# Patient Record
Sex: Female | Born: 1970 | Race: Black or African American | Hispanic: No | Smoking: Current every day smoker
Health system: Southern US, Community
[De-identification: ages and names within clinical notes are randomized; demographics above are authoritative.]

## PROBLEM LIST (undated history)

## (undated) HISTORY — PX: ABDOMINAL HYSTERECTOMY: SHX81

---

## 2001-12-04 ENCOUNTER — Ambulatory Visit (HOSPITAL_COMMUNITY): Admission: RE | Admit: 2001-12-04 | Discharge: 2001-12-04 | Payer: Self-pay | Admitting: Obstetrics and Gynecology

## 2001-12-04 ENCOUNTER — Encounter: Payer: Self-pay | Admitting: Obstetrics and Gynecology

## 2001-12-28 ENCOUNTER — Inpatient Hospital Stay (HOSPITAL_COMMUNITY): Admission: RE | Admit: 2001-12-28 | Discharge: 2001-12-31 | Payer: Self-pay | Admitting: Obstetrics and Gynecology

## 2006-05-14 ENCOUNTER — Emergency Department (HOSPITAL_COMMUNITY): Admission: EM | Admit: 2006-05-14 | Discharge: 2006-05-14 | Payer: Self-pay | Admitting: Emergency Medicine

## 2006-10-02 ENCOUNTER — Encounter: Admission: RE | Admit: 2006-10-02 | Discharge: 2006-10-02 | Payer: Self-pay | Admitting: Obstetrics

## 2007-01-24 ENCOUNTER — Emergency Department (HOSPITAL_COMMUNITY): Admission: EM | Admit: 2007-01-24 | Discharge: 2007-01-24 | Payer: Self-pay | Admitting: Emergency Medicine

## 2007-09-29 ENCOUNTER — Emergency Department (HOSPITAL_COMMUNITY): Admission: EM | Admit: 2007-09-29 | Discharge: 2007-09-29 | Payer: Self-pay | Admitting: Emergency Medicine

## 2008-03-25 ENCOUNTER — Emergency Department (HOSPITAL_COMMUNITY): Admission: EM | Admit: 2008-03-25 | Discharge: 2008-03-26 | Payer: Self-pay | Admitting: Emergency Medicine

## 2009-10-13 ENCOUNTER — Emergency Department (HOSPITAL_BASED_OUTPATIENT_CLINIC_OR_DEPARTMENT_OTHER): Admission: EM | Admit: 2009-10-13 | Discharge: 2009-10-13 | Payer: Self-pay | Admitting: Emergency Medicine

## 2009-10-13 ENCOUNTER — Ambulatory Visit: Payer: Self-pay | Admitting: Diagnostic Radiology

## 2010-09-26 LAB — COMPREHENSIVE METABOLIC PANEL
ALT: 15 U/L (ref 0–35)
AST: 24 U/L (ref 0–37)
BUN: 12 mg/dL (ref 6–23)
Calcium: 8.8 mg/dL (ref 8.4–10.5)
Chloride: 108 mEq/L (ref 96–112)
GFR calc Af Amer: >60 mL/min (ref >60)
Glucose, Bld: 104 mg/dL — ABNORMAL HIGH (ref 70–99)
Potassium: 3.7 mEq/L (ref 3.5–5.1)
Sodium: 142 mEq/L (ref 135–145)
Total Protein: 7.4 g/dL (ref 6.0–8.3)

## 2010-09-26 LAB — URINALYSIS, ROUTINE W REFLEX MICROSCOPIC
Bilirubin Urine: NEGATIVE
Glucose, UA: NEGATIVE mg/dL
Nitrite: NEGATIVE
Protein, ur: NEGATIVE mg/dL
Specific Gravity, Urine: 1.004 — ABNORMAL LOW (ref 1.005–1.030)

## 2010-09-26 LAB — PREGNANCY, URINE: Preg Test, Ur: NEGATIVE

## 2010-11-23 NOTE — Op Note (Signed)
[facility]  Patient:    [name], [name] Visit Number: [id number] MRN: [id number]          Service Type: MED Location: 4A A428 01 Attending Physician:  Tilda Burrow Dictated by:   Christin Bach, M.D. Proc. Date: 12/28/01 Admit Date:  12/28/2001                             Operative Report  PREOPERATIVE DIAGNOSES: 1. Chronic pelvic pain, suspected adenomyosis. 2. Uterine fibroids.  POSTOPERATIVE DIAGNOSIS: 1. Chronic pelvic pain, suspected adenomyosis.  PROCEDURES: 1. Total abdominal hysterectomy. 2. Appendectomy. 3. Wide excision of old abdominal scar.  SURGEON:  Christin Bach, M.D.  ASSISTANTMarlinda Mike, RN.  ANESTHESIA:  General, Idacavage, CRNA.  COMPLICATIONS:  None.  FINDINGS:  Relatively small uterus, soft boggy tissue suggestive of adenomyosis, elongated appendix with fecal material suspected in its tip (fecalith).  DESCRIPTION OF PROCEDURE:  The patient was taken to the operating room, prepped and draped for lower abdominal surgery. The old lower abdominal incision was marked and a 20 cm ellipse of skin and underlying connective tissue removed to include the old scar. This removed portion was approximately 8 cm in greatest width. This was excised. The opening down to the fascia completed a Pfannenstiel-type incision performed with a greater than usual semicircular nature to the incision to allow elevation and access to the midline. The peritoneal cavity was entered without difficulty. The Balfour retractor was positioned. The bowel pushed away with three moistened laparotomy tapes and attention directed to the pelvis. Inspection of the uterus showed it to be upper limits of normal size, 125 g or thereabouts, soft boggy, with normal ovaries bilaterally. Round ligaments were taken down bilaterally by doubly ligating them, transecting between the ligatures, and developing a bladder flap anteriorly. Uteroovarian ligaments and  fallopian tube on each side were then isolated, clamped, cut, and suture ligated bilaterally with 0 chromic suture ligature. The Lahey thyroid tenaculum used to grasp the uterine fundus allowed good mobility to the uterus. The uterine vessels were skeletonized on either side, then a curved Haney clamp placed across them in sequence with a Kelly clamp placed to control back-bleeding, transection, and suture ligature performed. The upper and lower cardinal ligaments were serially clamped, cut, and suture ligated on each side using straight Haney clamps, knife dissection, and 0 chromic suture ligature. Upon reaching the level of the cervix, a stab incision was attempted and the anterior cervical vaginal fornix appeared. It took a second try to properly identify the spot as the first site went through some of the cervix itself. The cervix was circumscribed and removed from off the vaginal cuff and four Kocher clamps used to grasp the cuff edges. Aldridge stitches were placed at each lateral edge angle to control vaginal angle bleeding. The cuff itself was then closed side-to-side using a series of interrupted figure-of-eight sutures of 0 chromic.  Adequate hemostasis was maintained with point cautery as necessary. The pelvis was inspected, irrigated copiously. No evidence of acute bleeding encountered so we proceeded to reapproximate the peritoneum bilaterally and this allowed for excellent support of the ovaries so that they should not reach the vaginal cuff.  Laparotomy tapes were removed and the appendix, which had been distinctly prominent upon entering the peritoneal cavity, was again inspected, found to be about 10 cm in length with easily accessible mesentery and suspicion of fecal material toward the tip of the appendix. We then  proceeded to do an appendectomy, which consisted of isolating the mesoappendix by two curved Heaney clamps placed across the mesoappendix pedicle with a  hemostat placed across the appendiceals base. The specimen was excised and the stump ligated with 0 chromic. The appendiceal stump was imbricated beneath the epiploic fat and adjacent peritoneal surfaces.  Removal of laparotomy equipment and a third irrigation of the pelvis was performed. This showed good hemostasis. The pelvis was reapproximated using 2-0 chromic and then the abdomen irrigated once again. Laparotomy equipment removed. Staple closure used to close the pelvic floor and then the procedure completed by 2-0 chromic closure to the interior peritoneum, 0 Vicryl closure of the fascia, interrupted 2-0 plain closure of the subcutaneous tissue with a J-P drain placed in the incision and allowed to exit through the left side and then staple closure of the skin. Estimated blood loss 250 cc. Dictated by:   Christin Bach, M.D. Attending Physician:  Tilda Burrow DD:  12/28/01 TD:  12/29/01 Job: [id number]

## 2010-11-23 NOTE — Discharge Summary (Signed)
[facility]  Patient:    [name] Visit Number: [id number] MRN: [id number]          Service Type: MED Location: 4A A428 01 Attending Physician:  Tilda Burrow Dictated by:   Christin Bach, M.D. Admit Date:  12/28/2001 Discharge Date: 12/31/2001                             Discharge Summary  ADMITTING DIAGNOSES: 1. Pelvic pain. 2. Uterine fibroids. 3. Clinical history of adenomyosis.  DISCHARGE DIAGNOSES: 1. Pelvic pain. 2. No uterine fibroids. 3. Possible adenomyosis.  PROCEDURE:  Total abdominal hysterectomy, appendectomy, excision of old abdominal scar on December 28, 2001.  DISCHARGE MEDICATIONS: 1. Tylox 1-2 q.4 h. p.r.n. pain. 2. Motrin 800 mg one q.6 h. x10 days for pain. 3. Reglan one p.o. a.c and h.s.  FOLLOWUP:  One week and four weeks our office.  HISTORY OF PRESENT ILLNESS:  This 40 year old female gravida 3, para 1, AB 2, prior C-section x1 was admitted for a hysterectomy after a longstanding evaluation of chronic pelvic pain with diagnostic laparoscopy on July 07, 2001 revealing a mobile uterus, tubes, and ovaries. There was no evidence of adhesions. A deep cul-de-sac was present with permanent uterosacral ligaments. The patient had a six-month history of continued pelvic pain leading to her request for a surgical correction, surgical efforts, and the resolution of pain. The abdomen pulls with bowel movements, pulls with physical activity. Recent ultrasound suggested multiple tiny fibroids, none of them measuring more than 3 cm long. Ultrasound in [facility] endometrial stripe was normal. Depo-Provera contraception has been used most recently.  PAST MEDICAL HISTORY:  The patient was admitted with a medical history benign, surgical history positive for a C-section in 1996, D&C 1998 and 2000.  PHYSICAL EXAMINATION:  GENERAL:  Showing a moderately overweight African-American female who appears alert and  oriented but with a somber affect. She is not felt to be clinically depressed.  VITAL SIGNS:  Blood pressure 130/70.  ABDOMEN:   Well-healed Pfannenstiel incision, slightly tender on the right side above the incision.  GENITALIA AND PELVIC:  Normal external genitalia, uterus slightly enlarged 10 cm on ultrasound.  HOSPITAL COURSE:  A hysterectomy was performed as described in the operative report showing an upper limits of normal size uterus estimated at 125 grams with a boggy uterus, normal-appearing ovaries which were left in place, and a 10 cm appendix with distal stool present. Pathology report showed a 92 gram uterus covered over with small submucosal leiomyomas but no evidence of adenomyosis. The appendix was normal histologically. The old scar was removed with some fibrous dense adhesions that was in the fatty tissue. Postoperatively, the patient had a hemoglobin of 13, hematocrit 38 compared to 13.7 and 39.2 preop. White count was increased to 17,000 on postop day #1. On the next day, she had a low-grade temperature to 100.3 and had what appeared to be a migraine headache which was treated with analgesics including Imitrex which seemed to help. Anesthesia consulted and decided that there was no evidence of an anesthesia related headache. The following day she was stable for discharge on December 31, 2001, seemingly somewhat depressed but denying any somatic complaints other than the obvious surgical discomforts. The patient was discharged home for followup in one week for staple removal and four weeks our office. Dictated by:   Christin Bach, M.D. Attending Physician:  Tilda Burrow DD:  01/13/02  TD:  01/16/02 Job: [id number]

## 2011-04-01 LAB — BASIC METABOLIC PANEL
Creatinine, Ser: 0.9
GFR calc Af Amer: >60
GFR calc non Af Amer: >60
Glucose, Bld: 92

## 2011-04-01 LAB — POCT CARDIAC MARKERS: Troponin i, poc: <0.05

## 2011-04-08 LAB — POCT I-STAT, CHEM 8
Calcium, Ion: 1.18
Chloride: 102
Creatinine, Ser: 1.2
Glucose, Bld: 86
HCT: 45
Hemoglobin: 15.3 — ABNORMAL HIGH
Potassium: 3.8
Sodium: 138

## 2011-04-08 LAB — CBC
HCT: 42.1
MCHC: 34.3
MCV: 94.8
Platelets: 252
WBC: 12.2 — ABNORMAL HIGH

## 2011-04-08 LAB — DIFFERENTIAL
Basophils Absolute: 0.1
Basophils Relative: 1
Eosinophils Absolute: 0.1
Lymphocytes Relative: 36
Monocytes Absolute: 0.7

## 2011-04-22 LAB — CBC
HCT: 39.6
Hemoglobin: 13.7
MCV: 95.6
Platelets: 274
RDW: 13.8
WBC: 14.3 — ABNORMAL HIGH

## 2011-04-22 LAB — I-STAT 8, (EC8 V) (CONVERTED LAB)
BUN: 8
Glucose, Bld: 112 — ABNORMAL HIGH
Potassium: 3.9
pCO2, Ven: 51.5 — ABNORMAL HIGH

## 2011-04-22 LAB — DIFFERENTIAL
Basophils Relative: 0
Eosinophils Absolute: 0.1
Lymphs Abs: 2.7
Monocytes Absolute: 0.7

## 2011-04-22 LAB — POCT I-STAT CREATININE: Operator id: 284141

## 2011-04-22 LAB — D-DIMER, QUANTITATIVE: D-Dimer, Quant: 0.76 — ABNORMAL HIGH

## 2013-08-24 ENCOUNTER — Emergency Department (HOSPITAL_COMMUNITY)
Admission: EM | Admit: 2013-08-24 | Discharge: 2013-08-24 | Disposition: A | Discharge status: Home / Self Care | Payer: No Typology Code available for payment source | Attending: Emergency Medicine | Admitting: Emergency Medicine

## 2013-08-24 ENCOUNTER — Emergency Department (HOSPITAL_COMMUNITY): Payer: No Typology Code available for payment source

## 2013-08-24 ENCOUNTER — Encounter (HOSPITAL_COMMUNITY): Payer: Self-pay | Admitting: Emergency Medicine

## 2013-08-24 DIAGNOSIS — Y9389 Activity, other specified: Secondary | ICD-10-CM | POA: Insufficient documentation

## 2013-08-24 DIAGNOSIS — IMO0002 Reserved for concepts with insufficient information to code with codable children: Secondary | ICD-10-CM | POA: Insufficient documentation

## 2013-08-24 DIAGNOSIS — F17200 Nicotine dependence, unspecified, uncomplicated: Secondary | ICD-10-CM | POA: Insufficient documentation

## 2013-08-24 DIAGNOSIS — R0789 Other chest pain: Secondary | ICD-10-CM

## 2013-08-24 DIAGNOSIS — S298XXA Other specified injuries of thorax, initial encounter: Secondary | ICD-10-CM | POA: Insufficient documentation

## 2013-08-24 DIAGNOSIS — Z79899 Other long term (current) drug therapy: Secondary | ICD-10-CM | POA: Insufficient documentation

## 2013-08-24 DIAGNOSIS — Y92410 Unspecified street and highway as the place of occurrence of the external cause: Secondary | ICD-10-CM | POA: Insufficient documentation

## 2013-08-24 DIAGNOSIS — M546 Pain in thoracic spine: Secondary | ICD-10-CM

## 2013-08-24 MED ORDER — IBUPROFEN 600 MG PO TABS: 600.0000 mg | ORAL_TABLET | Freq: Four times a day (QID) | ORAL | Status: AC | PRN

## 2013-08-24 MED ORDER — DIAZEPAM 5 MG PO TABS: 5.0000 mg | ORAL_TABLET | Freq: Two times a day (BID) | ORAL | Status: AC

## 2013-08-24 MED ORDER — DIAZEPAM 5 MG PO TABS: 5.0000 mg | ORAL_TABLET | Freq: Two times a day (BID) | ORAL | Status: DC

## 2013-08-24 MED ORDER — HYDROCODONE-ACETAMINOPHEN 5-325 MG PO TABS
2.0000 | ORAL_TABLET | Freq: Once | ORAL | Status: AC
  Administered 2013-08-24: 2 via ORAL
  Filled 2013-08-24: qty 2

## 2013-08-24 MED ORDER — IBUPROFEN 800 MG PO TABS
800.0000 mg | ORAL_TABLET | Freq: Once | ORAL | Status: AC
  Administered 2013-08-24: 800 mg via ORAL
  Filled 2013-08-24: qty 1

## 2013-08-24 MED ORDER — IBUPROFEN 600 MG PO TABS: 600.0000 mg | ORAL_TABLET | Freq: Four times a day (QID) | ORAL | Status: DC | PRN

## 2013-08-24 NOTE — Discharge Instructions (Signed)
Chest Wall Pain °Chest wall pain is pain felt in or around the chest bones and muscles. It may take up to 6 weeks to get better. It may take longer if you are active. Chest wall pain can happen on its own. Other times, things like germs, injury, coughing, or exercise can cause the pain. °HOME CARE  °· Avoid activities that make you tired or cause pain. Try not to use your chest, belly (abdominal), or side muscles. Do not use heavy weights. °· Put ice on the sore area. °· Put ice in a plastic bag. °· Place a towel between your skin and the bag. °· Leave the ice on for 15-20 minutes for the first 2 days. °· Only take medicine as told by your doctor. °GET HELP RIGHT AWAY IF:  °· You have more pain or are very uncomfortable. °· You have a fever. °· Your chest pain gets worse. °· You have new problems. °· You feel sick to your stomach (nauseous) or throw up (vomit). °· You start to sweat or feel lightheaded. °· You have a cough with mucus (phlegm). °· You cough up blood. °MAKE SURE YOU:  °· Understand these instructions. °· Will watch your condition. °· Will get help right away if you are not doing well or get worse. °Document Released: 12/11/2007 Document Revised: 09/16/2011 Document Reviewed: 02/18/2011 °ExitCare® Patient Information ©2014 [employer]. ° ° °Emergency Department Resource Guide °1) Find a Doctor and Pay Out of Pocket °Although you won't have to find out who is covered by your insurance plan, it is a good idea to ask around and get recommendations. You will then need to call the office and see if the doctor you have chosen will accept you as a new patient and what types of options they offer for patients who are self-pay. Some doctors offer discounts or will set up payment plans for their patients who do not have insurance, but you will need to ask so you aren't surprised when you get to your appointment. ° °2) Contact Your Local Health Department °Not all health departments have doctors that can see  patients for sick visits, but many do, so it is worth a call to see if yours does. If you don't know where your local health department is, you can check in your phone book. The CDC also has a tool to help you locate your state's health department, and many state websites also have listings of all of their local health departments. ° °3) Find a Walk-in Clinic °If your illness is not likely to be very severe or complicated, you may want to try a walk in clinic. These are popping up all over the country in pharmacies, drugstores, and shopping centers. They're usually staffed by nurse practitioners or physician assistants that have been trained to treat common illnesses and complaints. They're usually fairly quick and inexpensive. However, if you have serious medical issues or chronic medical problems, these are probably not your best option. ° °No Primary Care Doctor: °- Call [facility] Connect at  832-8000 - they can help you locate a primary care doctor that  accepts your insurance, provides certain services, etc. °- Physician Referral Service- 1-800-533-3463 ° °Chronic Pain Problems: °Organization         Address  Phone   Notes  °[facility]  (336) 297-2271 Patients need to be referred by their primary care doctor.  ° °Medication Assistance: °Organization         Address    Phone   Notes  °[place] [facility] [address] °[place], [place] [address] (336) 641-8030 --Must be a resident of [place] °-- Must have NO insurance coverage whatsoever (no Medicaid/ Medicare, etc.) °-- The pt. MUST have a primary care doctor that directs their care regularly and follows them in the community °  °[facility]  (866) 331-1348   °[employer]  (888) 892-1162   ° °Agencies that provide inexpensive medical care: °Organization         Address  Phone   Notes  °[facility]  (336) 832-8035   °[facility]    (336) 832-7272   °[facility] [address] °[place], [place] [address] (336) 832-4777   °[facility] [address], °[place] (336) 271-4999   °[facility]    (336) 373-0678   °[facility]    (336) 272-1050   °[facility] ° [address], [place] Phone:  (336) 832-4444, Fax:  (336) 832-4440 Hours of Operation:  9 am - 6 pm, M-F.  Also accepts Medicaid/Medicare and self-pay.  °[facility] ° [address], [place] Phone: (336) 832-3150, Fax: (336) 832-3151. Hours of Operation:  8:30 am - 5:30 pm, M-F.  Also accepts Medicaid and self-pay.  °[facility] [place] [address], [place] Phone: (336) 878-6027   °[facility] [address], [place], [place] (336)723-1848, Ext. 123 Mondays & Thursdays: 7-9 AM.  First 15 patients are seen on a first come, first serve basis. °  ° °Medicaid-accepting [place] Providers: ° °Organization         Address  Phone   Notes  °[facility] [address], [place] (336) 641-2100 Also accepts self-pay patients.  °[facility] [address], [place] ° (336) 856-9996   °[facility] [address], [place] (336) 288-8857   °[facility] [address] [place] [address], [place] (336) 299-7000   °Veita Bland [address], [place]  ° (336) 373-1557 Only accepts [place] Medicaid patients after they have their name applied to their card.  ° °Self-Pay (no insurance) in [place]: ° °Organization         Address  Phone   Notes  °Sickle Cell Patients, [facility] [address], [place] (336) 832-1970   °[facility] [address], [place] (336) 832-4400   ° [facility] ° [address], [place] (336) 992-4800   °[facility]/Dr. Osei-Bonsu °  [address] [place] [address], [place] or [address], [place] (336) 841-8500 Phone number for both [place] and [place] locations is the same.  °[facility] [address], [place] (336) 299-0000   °[facility] [address] [place] [address], [place] or [address] (336) 852-7530 °(336) 878-2260   °[facility] [address],  (336) 350-1642, phone; (336) 294-5005, fax Sees patients 1st and 3rd Saturday of every month.  Must not qualify for public or private insurance (i.e. Medicaid, Medicare, [place] Health Choice, Veterans' Benefits) • Household income should be no more than 200% of the poverty level •The clinic cannot treat you if you are pregnant or think you are pregnant •   Sexually transmitted diseases are not treated at the clinic.  ° ° °Dental Care: °Organization         Address  Phone  Notes  °[place] [facility] [address], [place] (336) 641-6152 Accepts children up to age 21 who are enrolled in Medicaid or [place] Health Choice; pregnant women with a Medicaid card; and children who have applied for Medicaid or [place] Health Choice, but were declined, whose parents can pay a reduced fee at time of service.  °[facility] [place]  [address], [place] (336) 641-7733 Accepts children up to age 21 who are enrolled in Medicaid or [place] Health Choice; pregnant women with a Medicaid card; and children who have applied for Medicaid or [place] Health Choice, but were declined, whose parents can pay a reduced fee at time of service.  °[facility] ° [address], [place] (336) 641-4533 Patients are seen by appointment only. Walk-ins are not accepted. [facility] will see patients 18 years of age and older. °Monday - Tuesday (8am-5pm) °Most Wednesdays (8:30-5pm) °$30 per visit, cash only  °[facility] ° [address], [place] (336) 641-4533 Patients are seen by appointment only. Walk-ins are not accepted. [facility] will see patients 18 years of age and older. °One Wednesday Evening (Monthly: Volunteer Based).  $30 per visit, cash only  °[facility]  (919) 537-3737 for adults; Children under age 4, call Graduate Pediatric Dentistry at (919) [phone]. Children aged 4-14, please call (919) [phone] to request a pediatric application. ° Dental services are provided in all areas of dental care including fillings, crowns and bridges, complete and partial dentures, implants, gum treatment, root canals, and extractions. Preventive care is also provided. Treatment is provided to both adults and children. °Patients are selected via a lottery and there is often a waiting list. °  °[facility] [address], °[place] ° (336) 763-8833 [email or web address].[email or web address] °  °[facility] [address], [place], [place] (336)723-1848, Ext. 123 Second and Fourth Thursday of each month, opens at 6:30 AM; Clinic ends at 9 AM.  Patients are seen on a first-come first-served basis, and a limited number are seen during each clinic.  ° °[facility] ° [address], [place], [place] (336) 723-7904   Eligibility Requirements °You must have lived in [place], [place], or [place] counties for at least the last three months. °  You cannot be eligible for state or federal sponsored healthcare insurance, including Veterans Administration, Medicaid, or Medicare. °  You generally cannot be eligible for healthcare insurance through your employer.  °  How to apply: °Eligibility screenings are held every Tuesday and Wednesday afternoon from 1:00 pm until 4:00 pm. You do not need an appointment for the interview!  °[facility] [address], [place], [place] 336-631-2330   °[place] [facility]  336-342-8273   °[place]   336-703-3100   °[facility]  336-570-6415   ° °Behavioral Health Resources in the Community: °Intensive Outpatient Programs °Organization         Address  Phone  Notes  °[facility] [address], [place], [place] 336-878-6098   °[facility] [address], [place], [place] 336-832-9800   °[facility] [address], [place],  °   336-882-2125   °[facility] [address],  °[place], [place] 1-800-853-5163 or 336-641-4981   °Substance Abuse Resources °Organization         Address  Phone  Notes  °[facility]  336-882-2125   °[facility]  336-784-9470   °[facility]  336-285-9073   °[facility]  336-845-3988   °Residential & Outpatient Substance Abuse Program  1-800-659-3381   °Psychological Services °Organization         Address  Phone  Notes  °[facility]  336- 832-9600   °[facility]  336- 378-7881   °[facility] [address], [place] 1-800-853-5163 or 336-641-4981   ° °Mobile Crisis Teams °Organization         Address  Phone  Notes  °[facility], Mobile Crisis Care Unit  1-877-626-1772   °Assertive °[facility] ° [address] [place], [place] 336-834-9664   °Sharon DeEsch [address] °[place] 336-554-5454   ° °Self-Help/Support Groups °Organization         Address  Phone             Notes  °[facility] [place] - variety of support groups  336- 373-1402 Call for more information  °Narcotics Anonymous (NA), [facility] [address], °[place]  2 meetings at this location  ° °Residential Treatment Programs °Organization         Address  Phone  Notes  °[facility] [address],    °[place]  1-866-801-8205   °[facility] ° [address], [place], [place] 704-293-8524   °[facility] [address], [place] 336-845-3988 Admissions: 8am-3pm M-F  °[facility] [address]    °[place], [place] 336-841-1104   °[facility] [address], [place], [place] 336-379-7146   °[facility] [address]  °[place], [place] 336-285-9073   °[facility] - Intensive Outpatient [address], [place], [place] 336-852-3033   °[facility] [address]  °[place], [place] 1-877-615-2722 or 336-784-9470   °[facility] ([facility] [address] [place], [place] 336-227-7417 Accepts Medicaid  °[facility] [address]  °[place] 1-800-659-3381 Substance Abuse/Addiction Treatment  ° °[place] Behavioral Health Resources °Organization         Address  Phone  Notes  °[facility]  (888) 581-9988   °Julie Brannon, PhD [address] [place], [place]   (336) 349-5553 or (336) 951-0000   °[facility]   [address] °[place], [place] (336) 349-4454   °[facility] [address], [place], [place] (336) 342-8316 Insurance/Medicaid/sponsorship through [facility]  °[facility] [address]                                    [place], [place] (336) 342-8316 Therapy/tele-psych/case  °[facility] [address]  ° [place], [place] (336) 349-2233    °Dr. Arfeen  (336) 349-4544   °[facility] [place]  [employer] [place] [facility] 1) [address], [place] °2) [address], [place] °3)  [address], [place] (336) 349-3220 °(336) 342-7768 ° °(336) 342-8140   °[place] Child Abuse [facility] (336) 342-1394 or (336) 342-3537 (  After Hours)    ° ° ° °  [address], [place] °3)  [address], [place] (336) 349-3220 °(336) 342-7768 ° °(336) 342-8140   °[place] Child Abuse [facility]line (336) 342-1394 or (336) 342-3537 (After Hours)    ° ° °

## 2013-08-24 NOTE — ED Provider Notes (Signed)
Medical screening examination/treatment/procedure(s) were performed by non-physician practitioner and as supervising physician I was immediately available for consultation/collaboration.  EKG Interpretation    Date/Time:  Tuesday August 24 2013 10:01:36 EST Ventricular Rate:  86 PR Interval:  134 QRS Duration: 73 QT Interval:  361 QTC Calculation: 432 R Axis:   50 Text Interpretation:  Sinus rhythm Baseline wander in lead(s) II III aVF No significant change since last tracing except baseline artifact Confirmed by Carleen Rhue  MD-J, Kamarah Bilotta ([id number]) on 08/24/2013 10:21:16 AM              Celene KrasJon R Khush Pasion, MD 08/24/13 1600
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
## 2015-05-08 ENCOUNTER — Emergency Department (HOSPITAL_BASED_OUTPATIENT_CLINIC_OR_DEPARTMENT_OTHER): Payer: PRIVATE HEALTH INSURANCE

## 2015-05-08 ENCOUNTER — Emergency Department (HOSPITAL_BASED_OUTPATIENT_CLINIC_OR_DEPARTMENT_OTHER)
Admission: EM | Admit: 2015-05-08 | Discharge: 2015-05-08 | Disposition: A | Discharge status: Home / Self Care | Payer: PRIVATE HEALTH INSURANCE | Attending: Emergency Medicine | Admitting: Emergency Medicine

## 2015-05-08 ENCOUNTER — Encounter (HOSPITAL_BASED_OUTPATIENT_CLINIC_OR_DEPARTMENT_OTHER): Payer: Self-pay | Admitting: Emergency Medicine

## 2015-05-08 DIAGNOSIS — Z9889 Other specified postprocedural states: Secondary | ICD-10-CM | POA: Insufficient documentation

## 2015-05-08 DIAGNOSIS — Z79899 Other long term (current) drug therapy: Secondary | ICD-10-CM | POA: Diagnosis not present

## 2015-05-08 DIAGNOSIS — Z90710 Acquired absence of both cervix and uterus: Secondary | ICD-10-CM | POA: Insufficient documentation

## 2015-05-08 DIAGNOSIS — K529 Noninfective gastroenteritis and colitis, unspecified: Secondary | ICD-10-CM | POA: Diagnosis not present

## 2015-05-08 DIAGNOSIS — R1084 Generalized abdominal pain: Secondary | ICD-10-CM

## 2015-05-08 DIAGNOSIS — Z720 Tobacco use: Secondary | ICD-10-CM | POA: Diagnosis not present

## 2015-05-08 LAB — COMPREHENSIVE METABOLIC PANEL
ALT: 24 U/L (ref 14–54)
AST: 34 U/L (ref 15–41)
Albumin: 4 g/dL (ref 3.5–5.0)
Alkaline Phosphatase: 52 U/L (ref 38–126)
Anion gap: 7 (ref 5–15)
BUN: 12 mg/dL (ref 6–20)
CO2: 24 mmol/L (ref 22–32)
CREATININE: 0.96 mg/dL (ref 0.44–1.00)
Calcium: 8.8 mg/dL — ABNORMAL LOW (ref 8.9–10.3)
Chloride: 106 mmol/L (ref 101–111)
Glucose, Bld: 99 mg/dL (ref 65–99)
Potassium: 4.1 mmol/L (ref 3.5–5.1)
Sodium: 137 mmol/L (ref 135–145)
TOTAL PROTEIN: 7.6 g/dL (ref 6.5–8.1)
Total Bilirubin: 0.7 mg/dL (ref 0.3–1.2)

## 2015-05-08 LAB — CBC WITH DIFFERENTIAL/PLATELET
BASOS ABS: 0 10*3/uL (ref 0.0–0.1)
Basophils Relative: 0 %
Eosinophils Absolute: 0.1 10*3/uL (ref 0.0–0.7)
Eosinophils Relative: 1 %
HCT: 41.1 % (ref 36.0–46.0)
Hemoglobin: 14.4 g/dL (ref 12.0–15.0)
LYMPHS ABS: 3.8 10*3/uL (ref 0.7–4.0)
Lymphocytes Relative: 28 %
MCH: 32 pg (ref 26.0–34.0)
MCHC: 35 g/dL (ref 30.0–36.0)
MCV: 91.3 fL (ref 78.0–100.0)
MONO ABS: 1.1 10*3/uL — AB (ref 0.1–1.0)
Monocytes Relative: 8 %
Neutro Abs: 8.6 10*3/uL — ABNORMAL HIGH (ref 1.7–7.7)
Neutrophils Relative %: 63 %
PLATELETS: 323 10*3/uL (ref 150–400)
RBC: 4.5 MIL/uL (ref 3.87–5.11)
RDW: 12.8 % (ref 11.5–15.5)
WBC: 13.6 10*3/uL — AB (ref 4.0–10.5)

## 2015-05-08 LAB — URINALYSIS, ROUTINE W REFLEX MICROSCOPIC
Bilirubin Urine: NEGATIVE
Glucose, UA: NEGATIVE mg/dL
HGB URINE DIPSTICK: NEGATIVE
KETONES UR: NEGATIVE mg/dL
Leukocytes, UA: NEGATIVE
Nitrite: NEGATIVE
Protein, ur: NEGATIVE mg/dL
Specific Gravity, Urine: 1.017 (ref 1.005–1.030)
Urobilinogen, UA: 1 mg/dL (ref 0.0–1.0)
pH: 6 (ref 5.0–8.0)

## 2015-05-08 LAB — LIPASE, BLOOD: LIPASE: 29 U/L (ref 11–51)

## 2015-05-08 MED ORDER — ONDANSETRON HCL 4 MG/2ML IJ SOLN
INTRAMUSCULAR | Status: AC
  Filled 2015-05-08: qty 2

## 2015-05-08 MED ORDER — ONDANSETRON 8 MG PO TBDP: ORAL_TABLET | ORAL | Status: AC

## 2015-05-08 MED ORDER — SODIUM CHLORIDE 0.9 % IV BOLUS (SEPSIS)
1000.0000 mL | Freq: Once | INTRAVENOUS | Status: AC
  Administered 2015-05-08: 1000 mL via INTRAVENOUS

## 2015-05-08 MED ORDER — ONDANSETRON HCL 4 MG/2ML IJ SOLN
4.0000 mg | Freq: Once | INTRAMUSCULAR | Status: AC
  Administered 2015-05-08: 4 mg via INTRAVENOUS

## 2015-05-08 MED ORDER — KETOROLAC TROMETHAMINE 30 MG/ML IJ SOLN
30.0000 mg | Freq: Once | INTRAMUSCULAR | Status: AC
  Administered 2015-05-08: 30 mg via INTRAVENOUS

## 2015-05-08 MED ORDER — KETOROLAC TROMETHAMINE 30 MG/ML IJ SOLN
INTRAMUSCULAR | Status: AC
  Filled 2015-05-08: qty 1

## 2015-05-08 MED ORDER — IOHEXOL 300 MG/ML  SOLN
125.0000 mL | Freq: Once | INTRAMUSCULAR | Status: AC | PRN
  Administered 2015-05-08: 125 mL via INTRAVENOUS

## 2015-05-08 MED ORDER — IOHEXOL 300 MG/ML  SOLN
50.0000 mL | Freq: Once | INTRAMUSCULAR | Status: AC | PRN
  Administered 2015-05-08: 50 mL via ORAL

## 2015-05-08 MED ORDER — IOHEXOL 300 MG/ML  SOLN: 50.0000 mL | Freq: Once | INTRAMUSCULAR | Status: DC | PRN

## 2015-05-08 NOTE — ED Notes (Signed)
Patient transported to CT 

## 2015-05-08 NOTE — ED Provider Notes (Signed)
CSN: [id number]     Arrival date & time 05/08/15  0134 History   First MD Initiated Contact with Patient 05/08/15 0221     Chief Complaint  Patient presents with  . Abdominal Pain     (Consider location/radiation/quality/duration/timing/severity/associated sxs/prior Treatment) HPI Comments: Patient is a 44 year old female with history of hysterectomy. She presents for evaluation of abdominal cramping. She does report some nausea, vomiting, and diarrhea for the past couple of days, however this has resolved. She denies any fevers or chills. She states that she feels as though her stomach is "churning". 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 She denies having consumed any suspicious foods that may have made her sick.  Patient is a 44 y.o. female presenting with abdominal pain. The history is provided by the patient.  Abdominal Pain Pain location:  Generalized Pain quality: cramping   Pain radiates to:  Does not radiate Pain severity:  Moderate Onset quality:  Sudden Duration:  4 days Timing:  Constant Progression:  Worsening Chronicity:  New Relieved by:  Nothing Worsened by:  Nothing tried Ineffective treatments:  None tried   History reviewed. No pertinent past medical history. Past Surgical History  Procedure Laterality Date  . Abdominal hysterectomy    . Cesarean section     No family history on file. Social History  Substance Use Topics  . Smoking status: Current Every Day Smoker -- 0.50 packs/day    Types: Cigarettes  . Smokeless tobacco: None  . Alcohol Use: Yes     Comment: occ   OB History    No data available     Review of Systems  Gastrointestinal: Positive for abdominal pain.  All other systems reviewed and are negative.     Allergies  Review of patient's allergies indicates no known allergies.  Home Medications   Prior to Admission medications   Medication Sig Start Date End Date Taking?  Authorizing Provider  acetaminophen (TYLENOL) 500 MG tablet Take 1,000 mg by mouth every 6 (six) hours as needed (pain.).   Yes Historical Provider, MD  diazepam (VALIUM) 5 MG tablet Take 1 tablet (5 mg total) by mouth 2 (two) times daily. 08/24/13  Yes Junius FinnerErin O'Malley, PA-C  ibuprofen (ADVIL,MOTRIN) 600 MG tablet Take 1 tablet (600 mg total) by mouth every 6 (six) hours as needed. 08/24/13  Yes Junius FinnerErin O'Malley, PA-C   BP 129/84 mmHg  Pulse 71  Temp(Src) 98.5 F (36.9 C) (Oral)  Resp 20  Ht 5' 8.5" (1.74 m)  Wt 285 lb (129.275 kg)  BMI 42.70 kg/m2  SpO2 99% Physical Exam  Constitutional: She is oriented to person, place, and time. She appears well-developed and well-nourished. No distress.  HENT:  Head: Normocephalic and atraumatic.  Neck: Normal range of motion. Neck supple.  Cardiovascular: Normal rate and regular rhythm.  Exam reveals no gallop and no friction rub.   No murmur heard. Pulmonary/Chest: Effort normal and breath sounds normal. No respiratory distress. She has no wheezes.  Abdominal: Soft. Bowel sounds are normal. She exhibits no distension. There is tenderness. There is no rebound and no guarding.  There is tenderness to palpation throughout the abdomen, however most notable in the epigastrium and periumbilical regions.  Musculoskeletal: Normal range of motion.  Neurological: She is alert and oriented to person, place, and time.  Skin: Skin is warm and dry. She is not diaphoretic.  Nursing note and vitals reviewed.   ED Course  Procedures (including critical care time) Labs Review Labs Reviewed  CBC WITH DIFFERENTIAL/PLATELET - Abnormal; Notable for the following:    WBC 13.6 (*)    All other components within normal limits  URINALYSIS, ROUTINE W REFLEX MICROSCOPIC (NOT AT [facility])  COMPREHENSIVE METABOLIC PANEL  LIPASE, BLOOD    Imaging Review No results found. I have personally reviewed and evaluated these images and lab results as part of my medical  decision-making.   EKG Interpretation None      MDM   Final diagnoses:  None    Patient presents with complaints of generalized abdominal pain, nausea, vomiting, diarrhea for the past several days. Her initial laboratory studies reveal a white count of 13,000, however are otherwise unremarkable. She is given IV fluids and medications, but continues with some discomfort. We discussed the risks and benefits of a CT scan of the patient has decided to go ahead and have this performed. This was done and reveals no evidence for intra-abdominal pathology. She will be discharged to home with Zofran and when necessary return.    Geoffery Lyons, MD 05/08/15 [phone]

## 2015-05-08 NOTE — ED Notes (Signed)
Pt returned from CT °

## 2015-05-08 NOTE — Discharge Instructions (Signed)
Zofran as prescribed.  Return to the emergency department if you develop severe abdominal pain, bloody stool, or other new and concerning symptoms.   Abdominal Pain, Adult Many things can cause abdominal pain. Usually, abdominal pain is not caused by a disease and will improve without treatment. It can often be observed and treated at home. Your health care provider will do a physical exam and possibly order blood tests and X-rays to help determine the seriousness of your pain. However, in many cases, more time must pass before a clear cause of the pain can be found. Before that point, your health care provider may not know if you need more testing or further treatment. HOME CARE INSTRUCTIONS Monitor your abdominal pain for any changes. The following actions may help to alleviate any discomfort you are experiencing:  Only take over-the-counter or prescription medicines as directed by your health care provider.  Do not take laxatives unless directed to do so by your health care provider.  Try a clear liquid diet (broth, tea, or water) as directed by your health care provider. Slowly move to a bland diet as tolerated. SEEK MEDICAL CARE IF:  You have unexplained abdominal pain.  You have abdominal pain associated with nausea or diarrhea.  You have pain when you urinate or have a bowel movement.  You experience abdominal pain that wakes you in the night.  You have abdominal pain that is worsened or improved by eating food.  You have abdominal pain that is worsened with eating fatty foods.  You have a fever. SEEK IMMEDIATE MEDICAL CARE IF:  Your pain does not go away within 2 hours.  You keep throwing up (vomiting).  Your pain is felt only in portions of the abdomen, such as the right side or the left lower portion of the abdomen.  You pass bloody or black tarry stools. MAKE SURE YOU:  Understand these instructions.  Will watch your condition.  Will get help right away if you  are not doing well or get worse.   This information is not intended to replace advice given to you by your health care provider. Make sure you discuss any questions you have with your health care provider.   Document Released: 04/03/2005 Document Revised: 03/15/2015 Document Reviewed: 03/03/2013 Elsevier Interactive Patient Education [employer]2016 [employer] Inc.

## 2015-05-08 NOTE — ED Notes (Signed)
Pt states abd pain x3-4 days. Reports some nausea, vomiting, and diarrhea Friday and Saturday, but none today. States she is unable to eat much and feels like her stomach is "just churning constantly". Rates pain 7/10. Reports taking Aleve at home with no relief.

## 2019-10-05 ENCOUNTER — Emergency Department (HOSPITAL_BASED_OUTPATIENT_CLINIC_OR_DEPARTMENT_OTHER): Payer: 59

## 2019-10-05 ENCOUNTER — Emergency Department (HOSPITAL_BASED_OUTPATIENT_CLINIC_OR_DEPARTMENT_OTHER)
Admission: EM | Admit: 2019-10-05 | Discharge: 2019-10-05 | Disposition: A | Discharge status: Home / Self Care | Payer: 59 | Attending: Emergency Medicine | Admitting: Emergency Medicine

## 2019-10-05 ENCOUNTER — Encounter (HOSPITAL_BASED_OUTPATIENT_CLINIC_OR_DEPARTMENT_OTHER): Payer: Self-pay | Admitting: Emergency Medicine

## 2019-10-05 ENCOUNTER — Other Ambulatory Visit: Payer: Self-pay

## 2019-10-05 DIAGNOSIS — M79631 Pain in right forearm: Secondary | ICD-10-CM

## 2019-10-05 DIAGNOSIS — M79632 Pain in left forearm: Secondary | ICD-10-CM | POA: Diagnosis present

## 2019-10-05 DIAGNOSIS — F17210 Nicotine dependence, cigarettes, uncomplicated: Secondary | ICD-10-CM | POA: Diagnosis not present

## 2019-10-05 DIAGNOSIS — W19XXXA Unspecified fall, initial encounter: Secondary | ICD-10-CM

## 2019-10-05 NOTE — Discharge Instructions (Addendum)
Your x-rays were within normal limits today.  You may continue to apply ice or heat to your forearm along with your left leg.  You may alternate ibuprofen or Tylenol to help with your pain.  Symptoms are likely to resolve in the next few days.

## 2019-10-05 NOTE — ED Triage Notes (Signed)
Pt c/o left arm pain and left knee and lower leg pain. Pt reports missing 2 steps yesterday and fell onto arm and also injuring left leg. Pt denies hitting head, denies loc. Bruising and abrasion apparent on left arm, leg

## 2019-10-05 NOTE — ED Provider Notes (Signed)
[facility] EMERGENCY DEPARTMENT Provider Note   CSN: [id number] Arrival date & time: 10/05/19  1224     History Chief Complaint  Patient presents with  . Fall    [name] is a 49 y.o. female.  49 y.o female with no PMH presents to the ED s/p fall. Patient was at a friends house last night when she reports missing the last two steps of the stairs, she then fell forward landing mainly on the left forearm. She reports pain along the left forearm, with a visible abrasion and hematoma.  She also endorses pain along her left lower leg with oozing noted.  She has taken some Tylenol to help with her pain.  She also reports is exacerbated by ambulation.  States she did not strike her head, she is currently not on any blood thinners.  No dizziness, lightheaded, headache, other injuries.   The history is provided by the patient.  Fall Pertinent negatives include no chest pain, no headaches and no shortness of breath.       History reviewed. No pertinent past medical history.  There are no problems to display for this patient.   Past Surgical History:  Procedure Laterality Date  . ABDOMINAL HYSTERECTOMY    . CESAREAN SECTION       OB History   No obstetric history on file.     History reviewed. No pertinent family history.  Social History   Tobacco Use  . Smoking status: Current Every Day Smoker    Packs/day: 0.50    Types: Cigarettes  Substance Use Topics  . Alcohol use: Yes    Comment: occ  . Drug use: No    Home Medications Prior to Admission medications   Medication Sig Start Date End Date Taking? Authorizing Provider  acetaminophen (TYLENOL) 500 MG tablet Take 1,000 mg by mouth every 6 (six) hours as needed (pain.).    [provider]  diazepam (VALIUM) 5 MG tablet Take 1 tablet (5 mg total) by mouth 2 (two) times daily. 08/24/13   Lurene Shadow, PA-C  ibuprofen (ADVIL,MOTRIN) 600 MG tablet Take 1 tablet (600 mg total) by mouth every 6  (six) hours as needed. 08/24/13   Lurene Shadow, PA-C  ondansetron (ZOFRAN ODT) 8 MG disintegrating tablet 8mg  ODT q4 hours prn nausea 05/08/15   05/10/15, MD    Allergies    Patient has no known allergies.  Review of Systems   Review of Systems  Constitutional: Negative for fever.  Respiratory: Negative for shortness of breath.   Cardiovascular: Negative for chest pain.  Musculoskeletal: Positive for arthralgias and myalgias.  Skin: Positive for color change.  Neurological: Negative for headaches.    Physical Exam Updated Vital Signs BP 138/86 (BP Location: Right Arm)   Pulse 78   Temp 98.4 F (36.9 C) (Oral)   Resp 18   Ht 5\' 8"  (1.727 m)   Wt 129.3 kg   SpO2 98%   BMI 43.33 kg/m   Physical Exam Vitals and nursing note reviewed.  Constitutional:      Appearance: Normal appearance.  HENT:     Head: Normocephalic and atraumatic.     Nose: Nose normal.     Mouth/Throat:     Mouth: Mucous membranes are moist.  Eyes:     Pupils: Pupils are equal, round, and reactive to light.  Cardiovascular:     Rate and Rhythm: Normal rate.  Pulmonary:     Effort: Pulmonary effort is  normal.     Breath sounds: No wheezing or rales.  Abdominal:     General: Abdomen is flat.     Tenderness: There is no right CVA tenderness, left CVA tenderness, guarding or rebound.  Musculoskeletal:     Left forearm: Swelling and tenderness present.     Cervical back: Normal range of motion and neck supple.     Left lower leg: Tenderness present. No swelling. No edema.       Legs:  Skin:    General: Skin is warm and dry.     Findings: Bruising, ecchymosis and erythema present.          Comments: Hematoma noted to left forearm, wrist and elbow joint with full ROM.  Left lower leg with bruising noted, but full ROM.   Neurological:     Mental Status: She is alert and oriented to person, place, and time.     ED Results / Procedures / Treatments   Labs (all labs ordered are listed,  but only abnormal results are displayed) Labs Reviewed - No data to display  EKG None  Radiology DG Forearm Left  Result Date: 10/05/2019 CLINICAL DATA:  Fall. Left forearm pain. Abrasion. Fall onto arm after missing a step 2 days ago. EXAM: LEFT FOREARM - 2 VIEW COMPARISON:  None. FINDINGS: Cortical margins of the radius and ulna are intact. There is no evidence of fracture or other focal bone lesions. Wrist and elbow alignment are maintained. Degenerative cystic change in the proximal lunate. Generalized soft tissue edema about the dorsal forearm. No soft tissue air or radiopaque foreign body. IMPRESSION: Soft tissue edema without acute fracture of the left forearm. Electronically Signed   By: Narda Rutherford M.D.   On: 10/05/2019 13:34   DG Tibia/Fibula Left  Result Date: 10/05/2019 CLINICAL DATA:  Fall. Left lower leg pain pain. Bruising and abrasion. Fall after missing a step 2 days ago. EXAM: LEFT TIBIA AND FIBULA - 2 VIEW COMPARISON:  None. FINDINGS: Distal most aspect of the ankle is not included in the field of view in the AP view. Cortical margins of the tibia and fibular intact. No evidence of acute fracture. Fragmentation of the anterior tibial tubercle is chronic and may be sequela of remote Osgood-Schlatter's. 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 Soft tissue edema about the anterior and lateral lower leg. No soft tissue air or radiopaque foreign body. IMPRESSION: Soft tissue edema without acute osseous abnormality of the tibia or fibula. Electronically Signed   By: Narda Rutherford M.D.   On: 10/05/2019 13:37    Procedures Procedures (including critical care time)  Medications Ordered in ED Medications - No data to display  ED Course  I have reviewed the triage vital signs and the nursing notes.  Pertinent labs & imaging results that were available during my care of the patient were reviewed by me and considered in my medical decision  making (see chart for details).    MDM Rules/Calculators/A&P   Patient with no pertinent past medical history presents to the ED status post fall yesterday.  Reports he took a tumble while missing 2 steps on the staircase, most of the fall was observed at the left side of her body, there is a present hematoma to the left forearm, left knee also has a minor abrasion along with lower leg hematoma.  She reports not striking her head, no loss of consciousness, currently not on any blood thinners.  Patient is hemodynamically  stable, x-rays of the left forearm along with the left tibia are within normal limits without any acute fracture or dislocation.  These results were discussed with patient at length.  She is to continue rice therapy along with place heat or ice to the area. Return precautions discussed at length.    Portions of this note were generated with [employer]. Dictation errors may occur despite best attempts at proofreading.  Final Clinical Impression(s) / ED Diagnoses Final diagnoses:  Fall, initial encounter  Right forearm pain    Rx / DC Orders ED Discharge Orders    None       Janeece Fitting, PA-C 10/05/19 [place], MD 10/07/19 [phone]

## 2021-06-08 ENCOUNTER — Other Ambulatory Visit: Payer: Self-pay

## 2021-06-08 ENCOUNTER — Emergency Department (HOSPITAL_BASED_OUTPATIENT_CLINIC_OR_DEPARTMENT_OTHER): Payer: Self-pay

## 2021-06-08 ENCOUNTER — Other Ambulatory Visit (HOSPITAL_BASED_OUTPATIENT_CLINIC_OR_DEPARTMENT_OTHER): Payer: Self-pay

## 2021-06-08 ENCOUNTER — Emergency Department (HOSPITAL_BASED_OUTPATIENT_CLINIC_OR_DEPARTMENT_OTHER)
Admission: EM | Admit: 2021-06-08 | Discharge: 2021-06-08 | Disposition: A | Discharge status: Home / Self Care | Payer: Self-pay | Attending: Emergency Medicine | Admitting: Emergency Medicine

## 2021-06-08 ENCOUNTER — Encounter (HOSPITAL_BASED_OUTPATIENT_CLINIC_OR_DEPARTMENT_OTHER): Payer: Self-pay

## 2021-06-08 DIAGNOSIS — F17210 Nicotine dependence, cigarettes, uncomplicated: Secondary | ICD-10-CM | POA: Insufficient documentation

## 2021-06-08 DIAGNOSIS — R0602 Shortness of breath: Secondary | ICD-10-CM | POA: Insufficient documentation

## 2021-06-08 DIAGNOSIS — Z20822 Contact with and (suspected) exposure to covid-19: Secondary | ICD-10-CM | POA: Insufficient documentation

## 2021-06-08 DIAGNOSIS — J069 Acute upper respiratory infection, unspecified: Secondary | ICD-10-CM

## 2021-06-08 DIAGNOSIS — R079 Chest pain, unspecified: Secondary | ICD-10-CM | POA: Insufficient documentation

## 2021-06-08 LAB — RESP PANEL BY RT-PCR (FLU A&B, COVID) ARPGX2
Influenza A by PCR: NEGATIVE
Influenza B by PCR: NEGATIVE
SARS Coronavirus 2 by RT PCR: NEGATIVE

## 2021-06-08 MED ORDER — PREDNISONE 10 MG PO TABS
40.0000 mg | ORAL_TABLET | Freq: Every day | ORAL | 0 refills | Status: AC
  Filled 2021-06-08: qty 8, 2d supply, fill #0

## 2021-06-08 MED ORDER — ALBUTEROL SULFATE HFA 108 (90 BASE) MCG/ACT IN AERS
1.0000 | INHALATION_SPRAY | Freq: Four times a day (QID) | RESPIRATORY_TRACT | 0 refills | Status: AC | PRN
  Filled 2021-06-08: qty 8.5, 25d supply, fill #0

## 2021-06-08 NOTE — ED Triage Notes (Signed)
Pt states treated for URI , pna bronchitis 2 months ago, cough never resolved, increased aches since last weekend.  Denies fevers.  Taking advil with little improvement, as well as cough drops.

## 2021-06-08 NOTE — ED Notes (Signed)
ED Provider at bedside. 

## 2021-06-08 NOTE — ED Provider Notes (Signed)
[facility] EMERGENCY DEPARTMENT Provider Note   CSN: [id number] Arrival date & time: 06/08/21  6073     History Chief Complaint  Patient presents with   Cough    Body aches    [name] is a 50 y.o. female.   Cough Associated symptoms: chest pain and shortness of breath   Associated symptoms: no rash   Patient presents with cough.  Around 2 months ago saw PCP and was diagnosed with pneumonia.  Was on unknown antibiotic but took it twice a day.  Has been doing better up until around 2 weeks ago.  Then had more cough.  More sputum production.  Also left posterior back pain.  Some chills.  States she was feeling like she was before.  No dysuria.  States there is occasional little blood in the coughing.  No definite sick contacts but does work with people.  COVID testing has been done 2 months ago.    History reviewed. No pertinent past medical history.  There are no problems to display for this patient.   Past Surgical History:  Procedure Laterality Date   ABDOMINAL HYSTERECTOMY     CESAREAN SECTION       OB History   No obstetric history on file.     History reviewed. No pertinent family history.  Social History   Tobacco Use   Smoking status: Every Day    Packs/day: 0.50    Types: Cigarettes  Substance Use Topics   Alcohol use: Yes    Comment: occ   Drug use: No    Home Medications Prior to Admission medications   Medication Sig Start Date End Date Taking? Authorizing Provider  albuterol (VENTOLIN HFA) 108 (90 Base) MCG/ACT inhaler Inhale 1-2 puffs into the lungs every 6 (six) hours as needed for wheezing or shortness of breath. 06/08/21  Yes Benjiman Core, MD  predniSONE (DELTASONE) 10 MG tablet Take 4 tablets (40 mg total) by mouth daily. 06/08/21  Yes Benjiman Core, MD  acetaminophen (TYLENOL) 500 MG tablet Take 1,000 mg by mouth every 6 (six) hours as needed (pain.).    [provider]  diazepam (VALIUM) 5 MG tablet Take 1  tablet (5 mg total) by mouth 2 (two) times daily. 08/24/13   Lurene Shadow, PA-C  ibuprofen (ADVIL,MOTRIN) 600 MG tablet Take 1 tablet (600 mg total) by mouth every 6 (six) hours as needed. 08/24/13   Lurene Shadow, PA-C  ondansetron (ZOFRAN ODT) 8 MG disintegrating tablet 8mg  ODT q4 hours prn nausea 05/08/15   05/10/15, MD    Allergies    Patient has no known allergies.  Review of Systems   Review of Systems  Constitutional:  Positive for appetite change and fatigue.  HENT:  Positive for congestion.   Respiratory:  Positive for cough and shortness of breath.   Cardiovascular:  Positive for chest pain.  Gastrointestinal:  Negative for abdominal pain.  Genitourinary:  Negative for flank pain.  Musculoskeletal:  Negative for arthralgias.  Skin:  Negative for rash.  Neurological:  Negative for weakness.  Psychiatric/Behavioral:  Negative for confusion.    Physical Exam Updated Vital Signs BP (!) 150/88 (BP Location: Right Arm)   Pulse 80   Temp 98.4 F (36.9 C) (Oral)   Resp 18   Ht 5\' 8"  (1.727 m)   Wt 130.2 kg   SpO2 99%   BMI 43.64 kg/m   Physical Exam Vitals reviewed.  Constitutional:      Appearance:  She is obese.  HENT:     Head: Atraumatic.     Mouth/Throat:     Mouth: Mucous membranes are moist.  Cardiovascular:     Rate and Rhythm: Regular rhythm.  Pulmonary:     Comments: Some wheezes at bilateral bases.  No focal rales or rhonchi. Abdominal:     Tenderness: There is no abdominal tenderness.  Musculoskeletal:        General: No tenderness.     Cervical back: Neck supple.  Skin:    General: Skin is warm.     Capillary Refill: Capillary refill takes less than 2 seconds.  Neurological:     Mental Status: She is alert and oriented to person, place, and time.    ED Results / Procedures / Treatments   Labs (all labs ordered are listed, but only abnormal results are displayed) Labs Reviewed  RESP PANEL BY RT-PCR (FLU A&B, COVID) [id number]     EKG None  Radiology DG Chest 2 View  Result Date: 06/08/2021 CLINICAL DATA:  Cough, pneumonia EXAM: CHEST - 2 VIEW COMPARISON:  None. FINDINGS: Normal mediastinum and cardiac silhouette. Normal pulmonary vasculature. No evidence of effusion, infiltrate, or pneumothorax. No acute bony abnormality. IMPRESSION: Normal chest radiograph Electronically Signed   By: Genevive Bi M.D.   On: 06/08/2021 09:49    Procedures Procedures   Medications Ordered in ED Medications - No data to display  ED Course  I have reviewed the triage vital signs and the nursing notes.  Pertinent labs & imaging results that were available during my care of the patient were reviewed by me and considered in my medical decision making (see chart for details).    MDM Rules/Calculators/A&P                           Patient URI symptoms.  Around 2 months ago had pneumonia diagnosis.  Reportedly had x-ray that showed pneumonia.  Had been on unknown antibiotic.  Had been doing better but then felt worse again 2 weeks ago.  Cough.  X-ray reassuring.  No pneumonia on x-ray.  Has some bilateral wheezes but no focal rales or rhonchi.  Denies underlying asthma or COPD but with the wheezing will treat with some steroids and inhaler.  Do not feel as if she needs more antibiotics.  Discharge home with outpatient follow-up as needed.  COVID and flu testing negative Final Clinical Impression(s) / ED Diagnoses Final diagnoses:  Upper respiratory tract infection, unspecified type    Rx / DC Orders ED Discharge Orders          Ordered    predniSONE (DELTASONE) 10 MG tablet  Daily        06/08/21 1048    albuterol (VENTOLIN HFA) 108 (90 Base) MCG/ACT inhaler  Every 6 hours PRN        06/08/21 1048             Benjiman Core, MD 06/08/21 1051

## 2021-06-08 NOTE — Discharge Instructions (Signed)
Follow-up with a primary care doctor as needed.  The inhaler and steroid should help with the breathing.

## 2021-06-11 ENCOUNTER — Other Ambulatory Visit: Payer: Self-pay

## 2021-06-11 ENCOUNTER — Emergency Department (HOSPITAL_COMMUNITY)
Admission: EM | Admit: 2021-06-11 | Discharge: 2021-06-11 | Disposition: A | Discharge status: Home / Self Care | Payer: Self-pay | Attending: Emergency Medicine | Admitting: Emergency Medicine

## 2021-06-11 ENCOUNTER — Emergency Department (HOSPITAL_COMMUNITY): Payer: Self-pay

## 2021-06-11 ENCOUNTER — Encounter (HOSPITAL_COMMUNITY): Payer: Self-pay

## 2021-06-11 DIAGNOSIS — F17210 Nicotine dependence, cigarettes, uncomplicated: Secondary | ICD-10-CM | POA: Insufficient documentation

## 2021-06-11 DIAGNOSIS — Z79899 Other long term (current) drug therapy: Secondary | ICD-10-CM | POA: Insufficient documentation

## 2021-06-11 DIAGNOSIS — J069 Acute upper respiratory infection, unspecified: Secondary | ICD-10-CM | POA: Insufficient documentation

## 2021-06-11 MED ORDER — BENZONATATE 100 MG PO CAPS
100.0000 mg | ORAL_CAPSULE | Freq: Once | ORAL | Status: AC
  Administered 2021-06-11: 100 mg via ORAL
  Filled 2021-06-11: qty 1

## 2021-06-11 MED ORDER — HYDROCODONE BIT-HOMATROP MBR 5-1.5 MG/5ML PO SOLN: 5.0000 mL | Freq: Four times a day (QID) | ORAL | 0 refills | Status: AC | PRN

## 2021-06-11 MED ORDER — DOXYCYCLINE HYCLATE 100 MG PO CAPS: 100.0000 mg | ORAL_CAPSULE | Freq: Two times a day (BID) | ORAL | 0 refills | Status: AC

## 2021-06-11 MED ORDER — ALBUTEROL SULFATE HFA 108 (90 BASE) MCG/ACT IN AERS
2.0000 | INHALATION_SPRAY | Freq: Once | RESPIRATORY_TRACT | Status: DC
  Filled 2021-06-11: qty 6.7

## 2021-06-11 NOTE — ED Triage Notes (Signed)
Patient c/o a productive cough with green sputum, and mid and lower back pain. Patient states pain worse with cough. Patient states she went to [facility] 3 days ago and was prescribed steroids and an allbuterol inhaler, but symptoms have worsened. Patient states she was negative for flu, covid and pneumonia.

## 2021-06-11 NOTE — ED Triage Notes (Signed)
Patient c/o a productive cough with green sputum since last night and worse today. Patient also c/o upper back pain x 1 week. Patient denies any injury or heavy lifting. Patient denies fever or SOB

## 2021-06-11 NOTE — ED Provider Notes (Signed)
[facility]-EMERGENCY DEPT Provider Note   CSN: [id number] Arrival date & time: 06/11/21  8338     History Chief Complaint  Patient presents with   Cough   Back Pain    [name] is a 50 y.o. female who presents to the ED today with complaint of  productive cough for the past 2-3 days. She reports hx of pneumonia a couple of months ago and was doing well up until this weekend. She went to [facility] and was diagnosed with URI - negative CXR and negative COVID/flu. She was prescribed prednisone and an inhaler without relief. She complains of tightness around her chest/back due to coughing. No SOB at rest however feels short of breath during coughing spells. No fevers or chills. Current everyday smoker; no hx COPD.     The history is provided by the patient and medical records.      History reviewed. No pertinent past medical history.  There are no problems to display for this patient.   Past Surgical History:  Procedure Laterality Date   ABDOMINAL HYSTERECTOMY     CESAREAN SECTION       OB History   No obstetric history on file.     Family History  Problem Relation Age of Onset   Hypertension Mother    Dementia Mother    Seizures Father     Social History   Tobacco Use   Smoking status: Every Day    Packs/day: 0.50    Types: Cigarettes  Vaping Use   Vaping Use: Never used  Substance Use Topics   Alcohol use: Yes    Comment: occ   Drug use: No    Home Medications Prior to Admission medications   Medication Sig Start Date End Date Taking? Authorizing Provider  doxycycline (VIBRAMYCIN) 100 MG capsule Take 1 capsule (100 mg total) by mouth 2 (two) times daily for 7 days. 06/11/21 06/18/21 Yes Ilyas Lipsitz, PA-C  HYDROcodone bit-homatropine (HYCODAN) 5-1.5 MG/5ML syrup Take 5 mLs by mouth every 6 (six) hours as needed for cough. 06/11/21  Yes Ladesha Pacini, PA-C  acetaminophen (TYLENOL) 500 MG tablet Take 1,000 mg by mouth every 6 (six)  hours as needed (pain.).    [provider]  albuterol (VENTOLIN HFA) 108 (90 Base) MCG/ACT inhaler Inhale 1-2 puffs into the lungs every 6 (six) hours as needed for wheezing or shortness of breath. 06/08/21   Benjiman Core, MD  diazepam (VALIUM) 5 MG tablet Take 1 tablet (5 mg total) by mouth 2 (two) times daily. 08/24/13   Lurene Shadow, PA-C  ibuprofen (ADVIL,MOTRIN) 600 MG tablet Take 1 tablet (600 mg total) by mouth every 6 (six) hours as needed. 08/24/13   Lurene Shadow, PA-C  ondansetron (ZOFRAN ODT) 8 MG disintegrating tablet 8mg  ODT q4 hours prn nausea 05/08/15   05/10/15, MD  predniSONE (DELTASONE) 10 MG tablet Take 4 tablets (40 mg total) by mouth daily. 06/08/21   14/2/22, MD    Allergies    Patient has no known allergies.  Review of Systems   Review of Systems  Constitutional:  Positive for fatigue. Negative for chills and fever.  HENT:  Positive for congestion.   Respiratory:  Positive for cough, chest tightness and shortness of breath. Negative for wheezing.   All other systems reviewed and are negative.  Physical Exam Updated Vital Signs BP (!) 165/106 (BP Location: Left Arm)   Pulse 83   Temp 98.1 F (36.7 C) (Oral)  Resp 18   Ht 5\' 8"  (1.727 m)   Wt 129.3 kg   SpO2 98%   BMI 43.33 kg/m   Physical Exam Vitals and nursing note reviewed.  Constitutional:      Appearance: She is not ill-appearing or diaphoretic.  HENT:     Head: Normocephalic and atraumatic.  Eyes:     Conjunctiva/sclera: Conjunctivae normal.  Cardiovascular:     Rate and Rhythm: Normal rate and regular rhythm.     Pulses: Normal pulses.  Pulmonary:     Breath sounds: No wheezing, rhonchi or rales.     Comments: Actively coughing. Speaking in shorter sentences between cough spells. When coughing has ceased patient able to speak in full sentences. LCTAB without evidence of wheezing. Satting 98% on RA.  Skin:    General: Skin is warm and dry.     Coloration: Skin  is not jaundiced.  Neurological:     Mental Status: She is alert.    ED Results / Procedures / Treatments   Labs (all labs ordered are listed, but only abnormal results are displayed) Labs Reviewed - No data to display  EKG None  Radiology DG Chest 2 View  Result Date: 06/11/2021 CLINICAL DATA:  Cough and back pain for 1 week. EXAM: CHEST - 2 VIEW COMPARISON:  Radiographs 06/08/2021 and CT 03/25/2008. FINDINGS: The heart size and mediastinal contours are normal. The lungs are clear. There is no pleural effusion or pneumothorax. No acute osseous findings are identified. IMPRESSION: Stable chest.  No active cardiopulmonary process. Electronically Signed   By: 03/27/2008 M.D.   On: 06/11/2021 11:42    Procedures Procedures   Medications Ordered in ED Medications  albuterol (VENTOLIN HFA) 108 (90 Base) MCG/ACT inhaler 2 puff (2 puffs Inhalation Patient Refused/Not Given 06/11/21 1332)  benzonatate (TESSALON) capsule 100 mg (100 mg Oral Given 06/11/21 1332)    ED Course  I have reviewed the triage vital signs and the nursing notes.  Pertinent labs & imaging results that were available during my care of the patient were reviewed by me and considered in my medical decision making (see chart for details).    MDM Rules/Calculators/A&P                           50 year old female who presents to the ED today with complaint of continued cough over the past several days.  She was seen in the ED a couple of days ago with a negative chest x-ray as well as negative COVID and flu.  She was discharged home with prednisone and albuterol inhaler as she had some mild wheezing on exam.  She was diagnosed with upper respiratory infection at that time.  She has not had any improvement prompting return ED visit today.  On arrival to the ED vitals are stable.  Patient appears to be in no acute distress.  She is actively coughing during triage exam and speaking in shorter sentences with coughing spells  however when coughing has ceased she is able speak in full sentences.  No obvious wheezing appreciated on exam at this time.  She has finished her prednisone course as she was only given 2 days worth.  She continues to use the inhaler without much relief.  We will plan for repeat chest x-ray at this time as she reports history of pneumonia several months ago and states this feels similar.  Chest x-ray negative. Will plan to provide 43 and  albuterol inhaler and reevaluate.  Patient is a current every day smoker.  She does not have a history of COPD however question if she could be experiencing COPD exacerbation at this time as wheezing was heard during initial ED visit a couple of days ago.  Do not feel she requires repeat testing for COVID and flu at this time.  I do suspect given her symptoms of congestion and fatigue that she is likely again experiencing an upper respiratory infection however if no improvement with Jerilynn Som may consider discharging home with short course of antibiotic for possible COPD exacerbation.  Pt reports no improvement after tessalon perles and inhaler however she is no longer excessively coughing. She is requesting something stronger than tessalon for cough - will discharge with hycodan. PDMP reviewed without suspicious activity. Will also discharge home with abx. Pt instructed to follow up with PCP for further eval. She is in agreement with plan and stable for discharge home.   This note was prepared using Dragon voice recognition software and may include unintentional dictation errors due to the inherent limitations of voice recognition software.   Final Clinical Impression(s) / ED Diagnoses Final diagnoses:  URI with cough and congestion    Rx / DC Orders ED Discharge Orders          Ordered    HYDROcodone bit-homatropine (HYCODAN) 5-1.5 MG/5ML syrup  Every 6 hours PRN        06/11/21 1418    doxycycline (VIBRAMYCIN) 100 MG capsule  2 times daily         06/11/21 1418             Discharge Instructions      Please pick up medication and take as prescribed  Follow up with your PCP by the end of the week for further evaluation of your symptoms  I would recommend cutting back on your smoking as this is also likely not helping  your symptoms  Return to the ED for any new/worsening symptoms       Tanda Rockers, PA-C 06/11/21 1420    Linwood Dibbles, MD 06/13/21 1008

## 2021-06-11 NOTE — ED Provider Notes (Signed)
Emergency Medicine Provider Triage Evaluation Note  [name] , a 50 y.o. female  was evaluated in triage.  Pt complains of productive cough for the past 2-3 days. She reports hx of pneumonia a couple of months ago and was doing well up until this weekend. She went to [facility] and was diagnosed with bronchitis - negative CXR and negative COVID/flu. She was prescribed prednisone and an inhaler without relief. She complains of tightness around her chest/back. No SOB. NO fevers or chills.Current everyday smoker; no hx COPD  Review of Systems  Positive: + cough, chest tightness Negative: - fevers, chills, wheezing  Physical Exam  BP (!) 165/106 (BP Location: Left Arm)   Pulse 83   Temp 98.1 F (36.7 C) (Oral)   Resp 18   Ht 5\' 8"  (1.727 m)   Wt 129.3 kg   SpO2 98%   BMI 43.33 kg/m  Gen:   Awake, no distress   Resp:  Normal effort. NO wheezing. LCTAB. Speaking in shorter sentences between coughing spells.  MSK:   Moves extremities without difficulty  Other:    Medical Decision Making  Medically screening exam initiated at 11:23 AM.  Appropriate orders placed.  was informed that the remainder of the evaluation will be completed by another provider, this initial triage assessment does not replace that evaluation, and the importance of remaining in the ED until their evaluation is complete.     [name], PA-C 06/11/21 1124    14/05/22, MD 06/13/21 [phone]

## 2021-06-11 NOTE — Discharge Instructions (Addendum)
Please pick up medication and take as prescribed  Follow up with your PCP by the end of the week for further evaluation of your symptoms  I would recommend cutting back on your smoking as this is also likely not helping  your symptoms  Return to the ED for any new/worsening symptoms

## 2021-06-11 NOTE — ED Notes (Signed)
Every note before 0855 was placed on the wrong patient

## 2021-06-11 NOTE — ED Triage Notes (Addendum)
Triage note placed on the wrong patient at 0851:34

## 2022-05-23 IMAGING — DX DG CHEST 2V
2 series · 2 of 2 positions shown · non-contrast
Comparison: None.

CLINICAL DATA: Cough, pneumonia

EXAM:
CHEST - 2 VIEW

[chest pa]
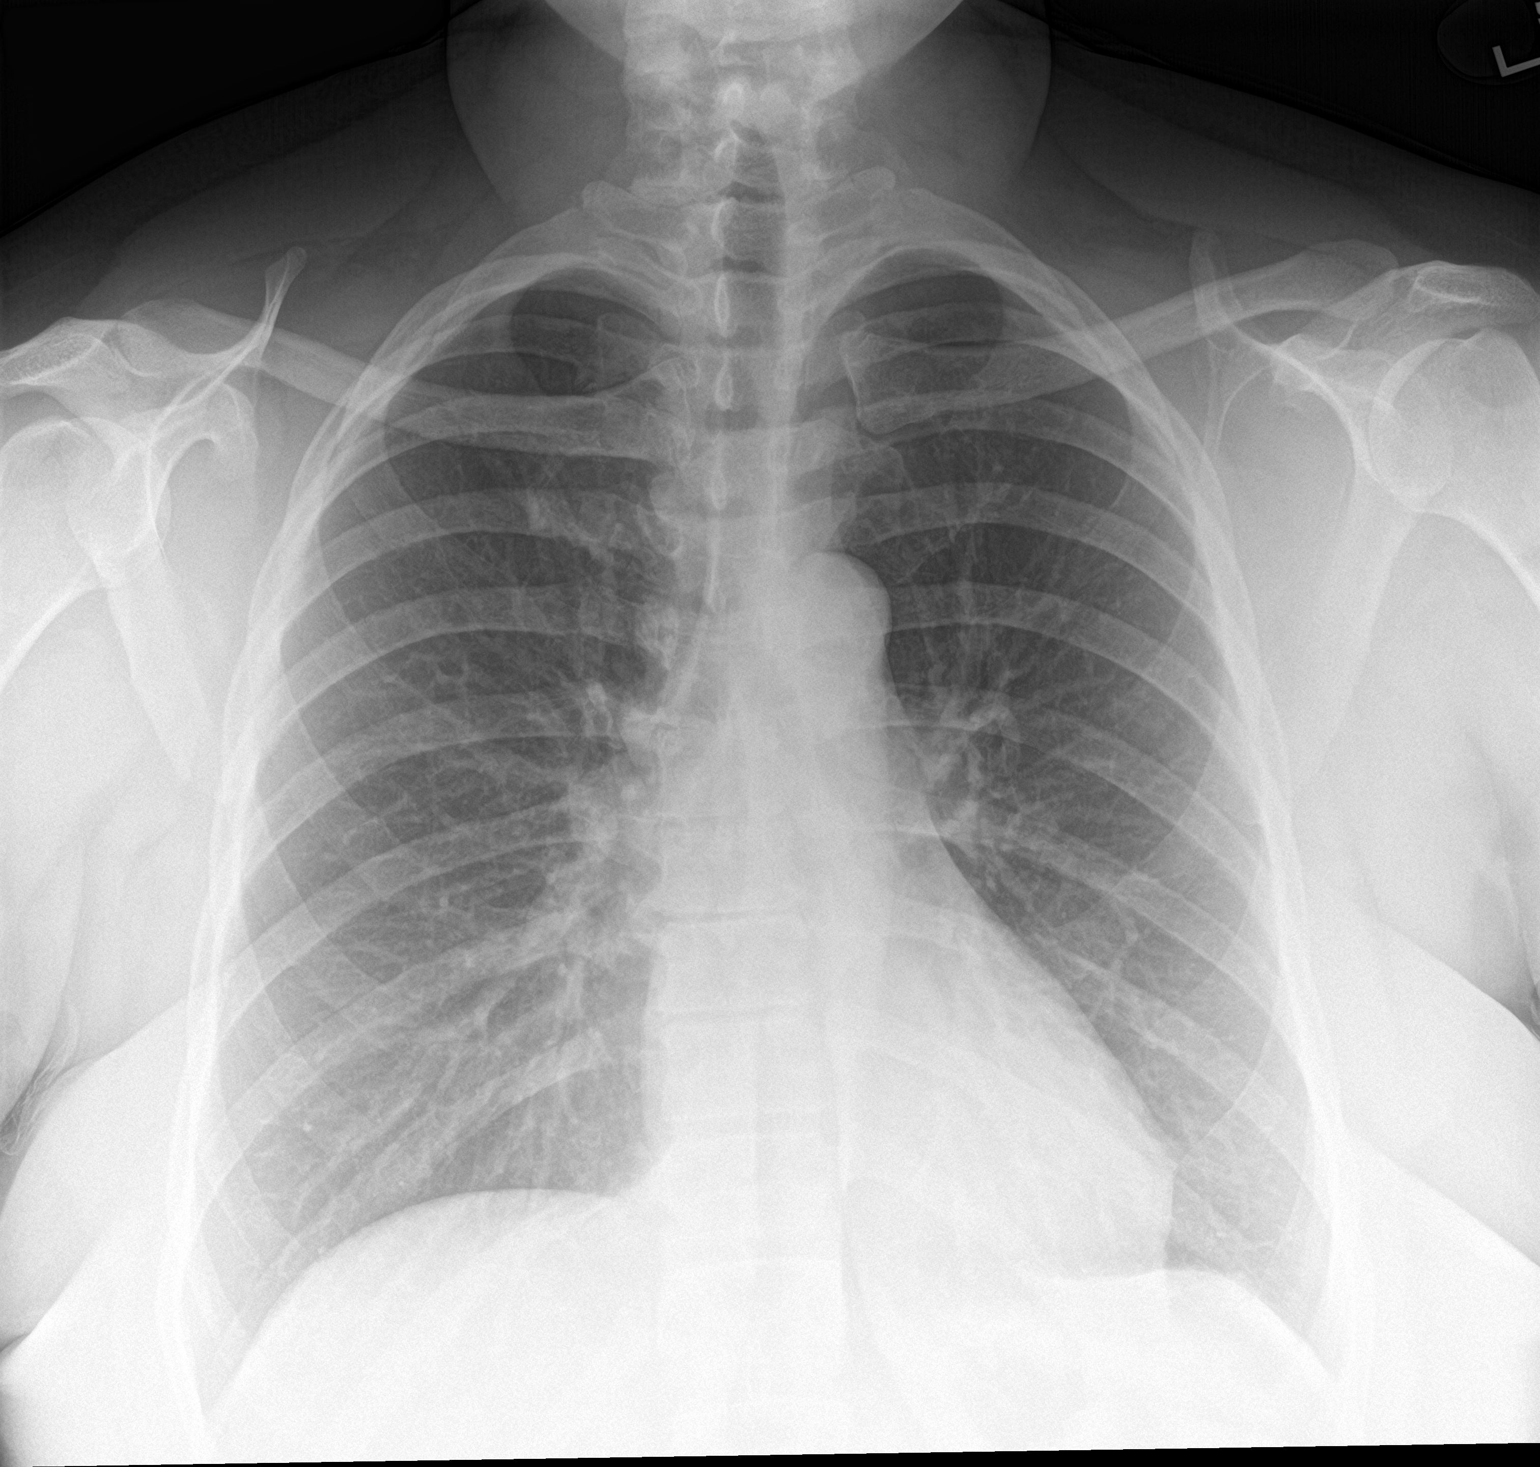

[chest lat]
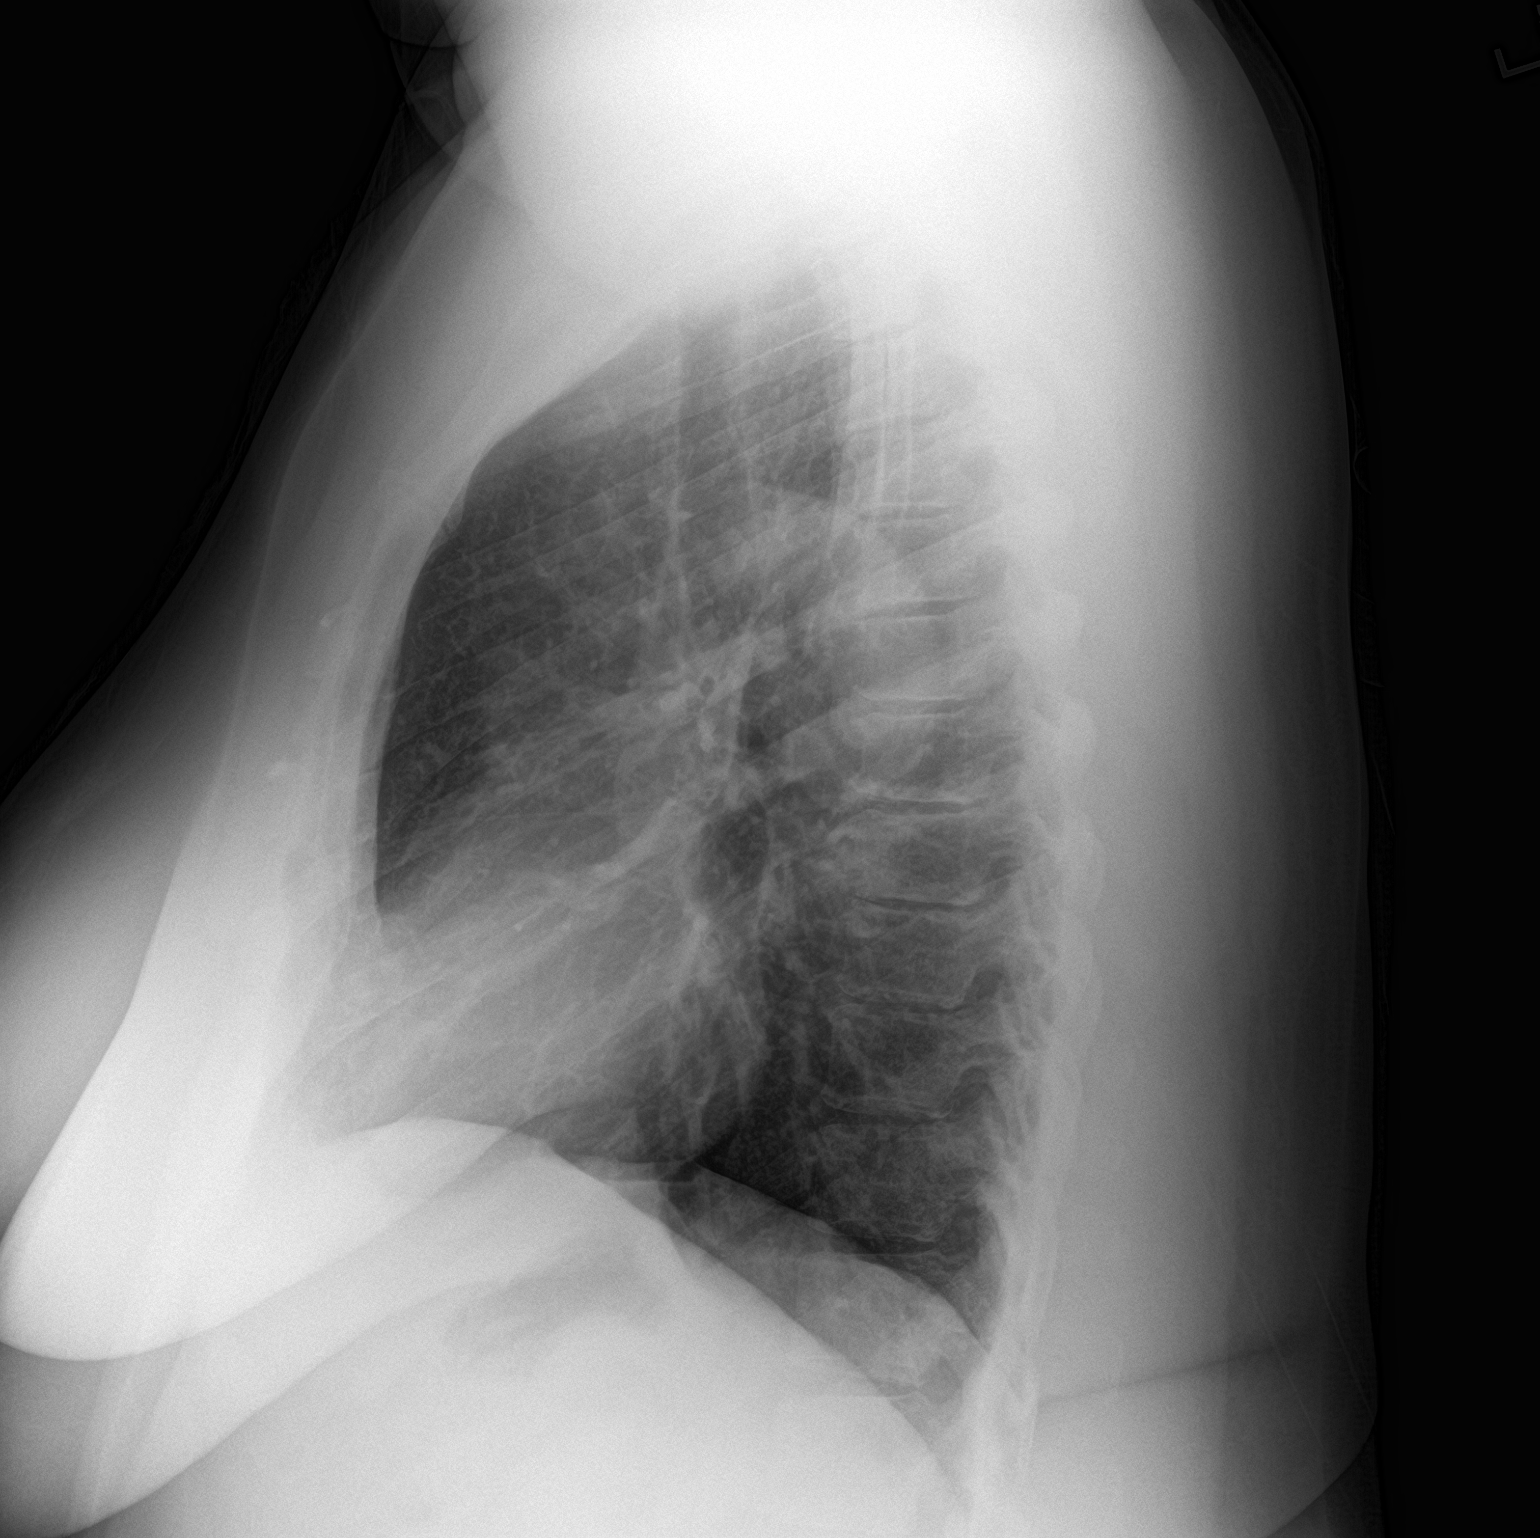

[2 of 2 positions shown; findings below may reference images not displayed]

FINDINGS: Normal mediastinum and cardiac silhouette. Normal pulmonary
vasculature. No evidence of effusion, infiltrate, or pneumothorax.
No acute bony abnormality.
IMPRESSION: Normal chest radiograph

## 2023-08-07 ENCOUNTER — Emergency Department (HOSPITAL_COMMUNITY): Payer: Self-pay

## 2023-08-07 ENCOUNTER — Emergency Department (HOSPITAL_COMMUNITY)
Admission: EM | Admit: 2023-08-07 | Discharge: 2023-08-08 | Disposition: A | Discharge status: Home / Self Care | Payer: Self-pay | Attending: Emergency Medicine | Admitting: Emergency Medicine

## 2023-08-07 ENCOUNTER — Encounter (HOSPITAL_COMMUNITY): Payer: Self-pay

## 2023-08-07 ENCOUNTER — Other Ambulatory Visit: Payer: Self-pay

## 2023-08-07 DIAGNOSIS — D72829 Elevated white blood cell count, unspecified: Secondary | ICD-10-CM | POA: Insufficient documentation

## 2023-08-07 DIAGNOSIS — R0781 Pleurodynia: Secondary | ICD-10-CM | POA: Insufficient documentation

## 2023-08-07 LAB — CBC WITH DIFFERENTIAL/PLATELET
Abs Immature Granulocytes: 0.07 10*3/uL (ref 0.00–0.07)
Basophils Absolute: 0.1 10*3/uL (ref 0.0–0.1)
Basophils Relative: 0 %
Eosinophils Absolute: 0.1 10*3/uL (ref 0.0–0.5)
Eosinophils Relative: 1 %
HCT: 41.6 % (ref 36.0–46.0)
Hemoglobin: 14.3 g/dL (ref 12.0–15.0)
Immature Granulocytes: 1 %
Lymphocytes Relative: 25 %
Lymphs Abs: 3.2 10*3/uL (ref 0.7–4.0)
MCH: 32 pg (ref 26.0–34.0)
MCHC: 34.4 g/dL (ref 30.0–36.0)
MCV: 93.1 fL (ref 80.0–100.0)
Monocytes Absolute: 0.8 10*3/uL (ref 0.1–1.0)
Monocytes Relative: 6 %
Neutro Abs: 8.9 10*3/uL — ABNORMAL HIGH (ref 1.7–7.7)
Neutrophils Relative %: 67 %
Platelets: 248 10*3/uL (ref 150–400)
RBC: 4.47 MIL/uL (ref 3.87–5.11)
RDW: 13.5 % (ref 11.5–15.5)
WBC: 13.1 10*3/uL — ABNORMAL HIGH (ref 4.0–10.5)
nRBC: 0 % (ref 0.0–0.2)

## 2023-08-07 LAB — COMPREHENSIVE METABOLIC PANEL
ALT: 15 U/L (ref 0–44)
AST: 18 U/L (ref 15–41)
Albumin: 3.7 g/dL (ref 3.5–5.0)
Alkaline Phosphatase: 66 U/L (ref 38–126)
Anion gap: 11 (ref 5–15)
BUN: 9 mg/dL (ref 6–20)
CO2: 23 mmol/L (ref 22–32)
Calcium: 9 mg/dL (ref 8.9–10.3)
Chloride: 105 mmol/L (ref 98–111)
Creatinine, Ser: 1.11 mg/dL — ABNORMAL HIGH (ref 0.44–1.00)
GFR, Estimated: 59 mL/min — ABNORMAL LOW (ref >60)
Glucose, Bld: 94 mg/dL (ref 70–99)
Potassium: 3.8 mmol/L (ref 3.5–5.1)
Sodium: 139 mmol/L (ref 135–145)
Total Bilirubin: 0.7 mg/dL (ref 0.0–1.2)
Total Protein: 7.4 g/dL (ref 6.5–8.1)

## 2023-08-07 LAB — D-DIMER, QUANTITATIVE: D-Dimer, Quant: 2.01 ug{FEU}/mL — ABNORMAL HIGH (ref 0.00–0.50)

## 2023-08-07 LAB — TROPONIN I (HIGH SENSITIVITY)
Troponin I (High Sensitivity): 8 ng/L (ref <18)
Troponin I (High Sensitivity): 9 ng/L (ref <18)

## 2023-08-07 MED ORDER — ACETAMINOPHEN 500 MG PO TABS
1000.0000 mg | ORAL_TABLET | Freq: Once | ORAL | Status: AC
  Administered 2023-08-07: 1000 mg via ORAL
  Filled 2023-08-07: qty 2

## 2023-08-07 MED ORDER — IOHEXOL 350 MG/ML SOLN
75.0000 mL | Freq: Once | INTRAVENOUS | Status: AC | PRN
  Administered 2023-08-07: 75 mL via INTRAVENOUS

## 2023-08-07 NOTE — ED Triage Notes (Signed)
Pt BIB EMS with c/o right sided rib cage pain that started about an hour ago. Pt denies CP or SOB. Pt reports worsening pain with inspiration.

## 2023-08-07 NOTE — ED Provider Triage Note (Signed)
Emergency Medicine Provider Triage Evaluation Note  [name] , a 53 y.o. female  was evaluated in triage.  Pt complains of chest pain, sudden onset pleuritic pain .  Review of Systems  Positive: Chest pain , pleurisy  Negative: Nausea, vomiting,   Physical Exam  BP (!) 169/95   Pulse 77   Temp 98.2 F (36.8 C) (Oral)   Resp 18   Wt 129.3 kg   SpO2 97%   BMI 43.33 kg/m  Gen:   Awake, no distress   Resp:  Normal effort  MSK:   Moves extremities without difficulty  Medical Decision Making  Medically screening exam initiated at 4:58 PM.  Appropriate orders placed.  [name] was informed that the remainder of the evaluation will be completed by another provider, this initial triage assessment does not replace that evaluation, and the importance of remaining in the ED until their evaluation is complete.     Kathy Grandchild, MD 08/07/23 [phone]

## 2023-08-08 MED ORDER — KETOROLAC TROMETHAMINE 30 MG/ML IJ SOLN
30.0000 mg | Freq: Once | INTRAMUSCULAR | Status: AC
  Administered 2023-08-08: 30 mg via INTRAVENOUS
  Filled 2023-08-08: qty 1

## 2023-08-08 MED ORDER — METHOCARBAMOL 500 MG PO TABS: 500.0000 mg | ORAL_TABLET | Freq: Two times a day (BID) | ORAL | 0 refills | Status: AC

## 2023-08-08 MED ORDER — OXYCODONE-ACETAMINOPHEN 5-325 MG PO TABS
1.0000 | ORAL_TABLET | Freq: Once | ORAL | Status: AC
  Administered 2023-08-08: 1 via ORAL
  Filled 2023-08-08: qty 1

## 2023-08-08 MED ORDER — DEXAMETHASONE SODIUM PHOSPHATE 10 MG/ML IJ SOLN
10.0000 mg | Freq: Once | INTRAMUSCULAR | Status: AC
  Administered 2023-08-08: 10 mg via INTRAVENOUS
  Filled 2023-08-08: qty 1

## 2023-08-08 NOTE — Discharge Instructions (Signed)
Your work-up today was reassuring.  No findings of blood clot or other abnormalities within the chest. Take the prescribed medication as directed.  Can continue using warm compresses. Keep an eye on the area to see if rash develops. Follow-up with your primary care doctor. Return to the ED for new or worsening symptoms.

## 2023-08-08 NOTE — ED Notes (Addendum)
Warm pack given for her to place on painful sight. Pt reports her pain has lessened since being here but still painful.

## 2023-08-08 NOTE — ED Provider Notes (Signed)
[place] EMERGENCY DEPARTMENT AT [facility] Provider Note   CSN: [id number] Arrival date & time: 08/07/23  1604     History  Chief Complaint  Patient presents with   Rib Cage Pain    [name] is a 53 y.o. female.  The history is provided by the patient and medical records.   53 year old female presenting to the ED with acute onset of right sided rib pain that began yesterday at 3:30 PM while she was at work.  States she was just sitting at her desk, no fall or trauma.  She did fall 2 weeks ago on the ice but fell onto left side, not right.  Pain localized to right lateral ribs.  States pain worse with movement, deep breathing.  No fever/chills.  No recent cough or other URI symptoms.  No vomiting/diarrhea.  Was given tylenol in triage without change.    Home Medications Prior to Admission medications   Medication Sig Start Date End Date Taking? Authorizing Provider  acetaminophen (TYLENOL) 500 MG tablet Take 1,000 mg by mouth every 6 (six) hours as needed (pain.).    [provider]  albuterol (VENTOLIN HFA) 108 (90 Base) MCG/ACT inhaler Inhale 1-2 puffs into the lungs every 6 (six) hours as needed for wheezing or shortness of breath. 06/08/21   Benjiman Core, MD  diazepam (VALIUM) 5 MG tablet Take 1 tablet (5 mg total) by mouth 2 (two) times daily. 08/24/13   Lurene Shadow, PA-C  HYDROcodone bit-homatropine (HYCODAN) 5-1.5 MG/5ML syrup Take 5 mLs by mouth every 6 (six) hours as needed for cough. 06/11/21   Hyman Hopes, Margaux, PA-C  ibuprofen (ADVIL,MOTRIN) 600 MG tablet Take 1 tablet (600 mg total) by mouth every 6 (six) hours as needed. 08/24/13   Lurene Shadow, PA-C  ondansetron (ZOFRAN ODT) 8 MG disintegrating tablet 8mg  ODT q4 hours prn nausea 05/08/15   Geoffery Lyons, MD  predniSONE (DELTASONE) 10 MG tablet Take 4 tablets (40 mg total) by mouth daily. 06/08/21   Benjiman Core, MD      Allergies    Patient has no known allergies.    Review of  Systems   Review of Systems  Cardiovascular:  Positive for chest pain (right ribs).  All other systems reviewed and are negative.   Physical Exam Updated Vital Signs BP (!) 151/88 (BP Location: Left Arm)   Pulse 63   Temp 98.4 F (36.9 C) (Oral)   Resp 16   Wt 129.3 kg   SpO2 97%   BMI 43.33 kg/m   Physical Exam Vitals and nursing note reviewed.  Constitutional:      Appearance: She is well-developed.  HENT:     Head: Normocephalic and atraumatic.  Eyes:     Conjunctiva/sclera: Conjunctivae normal.     Pupils: Pupils are equal, round, and reactive to light.  Cardiovascular:     Rate and Rhythm: Normal rate and regular rhythm.     Heart sounds: Normal heart sounds.  Pulmonary:     Effort: Pulmonary effort is normal. No respiratory distress.     Breath sounds: Normal breath sounds. No rhonchi.     Comments: Lungs clear without wheezes or rhonchi Chest:       Comments: Tender along right lateral ribs, there is no acute deformity, no overlying rash, erythema, or other skin changes Abdominal:     General: Bowel sounds are normal.     Palpations: Abdomen is soft.     Comments: Abdomen soft, nontender  Musculoskeletal:        General: Normal range of motion.     Cervical back: Normal range of motion.  Skin:    General: Skin is warm and dry.  Neurological:     Mental Status: She is alert and oriented to person, place, and time.     ED Results / Procedures / Treatments   Labs (all labs ordered are listed, but only abnormal results are displayed) Labs Reviewed  COMPREHENSIVE METABOLIC PANEL - Abnormal; Notable for the following components:      Result Value   Creatinine, Ser 1.11 (*)    GFR, Estimated 59 (*)    All other components within normal limits  CBC WITH DIFFERENTIAL/PLATELET - Abnormal; Notable for the following components:   WBC 13.1 (*)    Neutro Abs 8.9 (*)    All other components within normal limits  D-DIMER, QUANTITATIVE - Abnormal; Notable for  the following components:   D-Dimer, Quant 2.01 (*)    All other components within normal limits  TROPONIN I (HIGH SENSITIVITY)  TROPONIN I (HIGH SENSITIVITY)    EKG None  Radiology CT Angio Chest PE W and/or Wo Contrast Result Date: 08/08/2023 CLINICAL DATA:  Positive D-dimer, right-sided chest pain. EXAM: CT ANGIOGRAPHY CHEST WITH CONTRAST TECHNIQUE: Multidetector CT imaging of the chest was performed using the standard protocol during bolus administration of intravenous contrast. Multiplanar CT image reconstructions and MIPs were obtained to evaluate the vascular anatomy. RADIATION DOSE REDUCTION: This exam was performed according to the departmental dose-optimization program which includes automated exposure control, adjustment of the mA and/or kV according to patient size and/or use of iterative reconstruction technique. CONTRAST:  75mL OMNIPAQUE IOHEXOL 350 MG/ML SOLN COMPARISON:  CT angiogram chest 03/26/2008 FINDINGS: Cardiovascular:  Twos 1 Mediastinum/Nodes: No enlarged mediastinal, hilar, or axillary lymph nodes. Thyroid gland, trachea, and esophagus demonstrate no significant findings. Lungs/Pleura: Lungs are clear. No pleural effusion or pneumothorax. Upper Abdomen: No acute abnormality. Musculoskeletal: No chest wall abnormality. No acute or significant osseous findings. Review of the MIP images confirms the above findings. IMPRESSION: 1. No evidence for pulmonary embolism. 2. No acute cardiopulmonary process. Electronically Signed   By: Darliss Cheney M.D.   On: 08/08/2023 00:12   DG Ribs Unilateral W/Chest Right Result Date: 08/07/2023 CLINICAL DATA:  Right chest wall pain. EXAM: RIGHT RIBS AND CHEST - 3+ VIEW COMPARISON:  Chest radiograph dated 06/11/2021 FINDINGS: Bibasilar streaky atelectasis. Faint density in the right mid lung field may represent atelectasis. Developing infiltrate is not excluded. No consolidative changes. There is no pleural effusion or pneumothorax. The cardiac  silhouette is within normal limits. No acute osseous pathology. No displaced rib fractures. IMPRESSION: 1. No displaced rib fractures. 2. Bibasilar atelectasis. Developing infiltrate in the right mid lung field is not excluded. Electronically Signed   By: Elgie Collard M.D.   On: 08/07/2023 18:41    Procedures Procedures    Medications Ordered in ED Medications  ketorolac (TORADOL) 30 MG/ML injection 30 mg (has no administration in time range)  dexamethasone (DECADRON) injection 10 mg (has no administration in time range)  oxyCODONE-acetaminophen (PERCOCET/ROXICET) 5-325 MG per tablet 1 tablet (has no administration in time range)  acetaminophen (TYLENOL) tablet 1,000 mg (1,000 mg Oral Given 08/07/23 1734)  iohexol (OMNIPAQUE) 350 MG/ML injection 75 mL (75 mLs Intravenous Contrast Given 08/07/23 2357)    ED Course/ Medical Decision Making/ A&P  Medical Decision Making Amount and/or Complexity of Data Reviewed Labs: ordered. Radiology: ordered and independent interpretation performed. ECG/medicine tests: ordered and independent interpretation performed.  Risk Prescription drug management.   54 year old female presenting to the ED with acute onset of right sided rib pain that began yesterday afternoon at 3:30 PM while sitting at her desk working.  Pain worse with movement and deep breathing.  She is tender to the right lateral ribs on exam but there is no acute signs of trauma.  No deformity.  No rash suggestive of shingles.  Work up initiated from triage-- EKG NSR.  Labs with mild leukocytosis, no significant electrolyte derangement.  Troponin negative x 2.  Chest x-ray is clear.  D-dimer was elevated so CTA had already been obtained, no acute findings-- no PE, CAP, or other findings.    Again, no rash to suggest shingles present currently.  Did have a fall about 2 weeks ago.  May just have some pleurisy.  Remains hemodynamically stable.  Will plan to  treat symptomatically.  She usually follows with [facility].  She will be given copies of her workup from today for follow-up if needed.  Will have her monitor closely to see if any rash develops (such as shingles).  She will need to follow-up with PCP.  Return here for new concerns.  Final Clinical Impression(s) / ED Diagnoses Final diagnoses:  Rib pain on right side    Rx / DC Orders ED Discharge Orders     None         Garlon Hatchet, PA-C 08/08/23 0554    Nicanor Alcon, April, MD 08/08/23 [phone]

## 2024-03-19 ENCOUNTER — Other Ambulatory Visit: Payer: Self-pay
